# Patient Record
Sex: Female | Born: 1945 | Race: White | Hispanic: No | Marital: Married | Smoking: Never smoker
Health system: Southern US, Community
[De-identification: ages and names within clinical notes are randomized; demographics above are authoritative.]

## PROBLEM LIST (undated history)

## (undated) DIAGNOSIS — K219 Gastro-esophageal reflux disease without esophagitis: Secondary | ICD-10-CM

## (undated) DIAGNOSIS — I2510 Atherosclerotic heart disease of native coronary artery without angina pectoris: Secondary | ICD-10-CM

## (undated) HISTORY — PX: BACK SURGERY: SHX140

## (undated) HISTORY — PX: CORONARY ANGIOPLASTY WITH STENT PLACEMENT: SHX49

## (undated) HISTORY — PX: CHOLECYSTECTOMY: SHX55

## (undated) HISTORY — PX: SHOULDER SURGERY: SHX246

## (undated) HISTORY — PX: ANKLE SURGERY: SHX546

## (undated) HISTORY — PX: FOOT SURGERY: SHX648

---

## 2016-07-07 ENCOUNTER — Encounter (HOSPITAL_BASED_OUTPATIENT_CLINIC_OR_DEPARTMENT_OTHER): Payer: Self-pay | Admitting: *Deleted

## 2016-07-07 ENCOUNTER — Emergency Department (HOSPITAL_BASED_OUTPATIENT_CLINIC_OR_DEPARTMENT_OTHER)
Admission: EM | Admit: 2016-07-07 | Discharge: 2016-07-07 | Disposition: A | Discharge status: Home / Self Care | Payer: No Typology Code available for payment source | Attending: Emergency Medicine | Admitting: Emergency Medicine

## 2016-07-07 ENCOUNTER — Emergency Department (HOSPITAL_BASED_OUTPATIENT_CLINIC_OR_DEPARTMENT_OTHER): Payer: No Typology Code available for payment source

## 2016-07-07 DIAGNOSIS — R079 Chest pain, unspecified: Secondary | ICD-10-CM | POA: Insufficient documentation

## 2016-07-07 DIAGNOSIS — S299XXA Unspecified injury of thorax, initial encounter: Secondary | ICD-10-CM | POA: Diagnosis present

## 2016-07-07 DIAGNOSIS — Y999 Unspecified external cause status: Secondary | ICD-10-CM | POA: Insufficient documentation

## 2016-07-07 DIAGNOSIS — I2510 Atherosclerotic heart disease of native coronary artery without angina pectoris: Secondary | ICD-10-CM | POA: Insufficient documentation

## 2016-07-07 DIAGNOSIS — Y92410 Unspecified street and highway as the place of occurrence of the external cause: Secondary | ICD-10-CM | POA: Diagnosis not present

## 2016-07-07 DIAGNOSIS — Y9389 Activity, other specified: Secondary | ICD-10-CM | POA: Insufficient documentation

## 2016-07-07 DIAGNOSIS — Z79899 Other long term (current) drug therapy: Secondary | ICD-10-CM | POA: Insufficient documentation

## 2016-07-07 DIAGNOSIS — M79642 Pain in left hand: Secondary | ICD-10-CM | POA: Diagnosis not present

## 2016-07-07 HISTORY — DX: Gastro-esophageal reflux disease without esophagitis: K21.9

## 2016-07-07 HISTORY — DX: Atherosclerotic heart disease of native coronary artery without angina pectoris: I25.10

## 2016-07-07 MED ORDER — HYDROCODONE-ACETAMINOPHEN 5-325 MG PO TABS: 1.0000 | ORAL_TABLET | Freq: Four times a day (QID) | ORAL | 0 refills | Status: AC | PRN

## 2016-07-07 MED ORDER — CYCLOBENZAPRINE HCL 10 MG PO TABS: 10.0000 mg | ORAL_TABLET | Freq: Two times a day (BID) | ORAL | 0 refills | Status: AC | PRN

## 2016-07-07 NOTE — ED Notes (Signed)
ED Provider at bedside. 
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
